# Patient Record
Sex: Male | Born: 1955 | Race: White | Hispanic: No | Marital: Married
Health system: Southern US, Community
[De-identification: ages and names within clinical notes are randomized; demographics above are authoritative.]

---

## 2001-11-09 ENCOUNTER — Encounter: Payer: Self-pay | Admitting: Orthopaedic Surgery

## 2001-11-11 ENCOUNTER — Ambulatory Visit (HOSPITAL_COMMUNITY): Admission: RE | Admit: 2001-11-11 | Discharge: 2001-11-12 | Payer: Self-pay | Admitting: Orthopaedic Surgery

## 2001-11-11 ENCOUNTER — Encounter: Payer: Self-pay | Admitting: Orthopaedic Surgery

## 2001-11-12 ENCOUNTER — Encounter: Payer: Self-pay | Admitting: Orthopaedic Surgery

## 2017-04-01 ENCOUNTER — Other Ambulatory Visit: Payer: Self-pay | Admitting: Orthopaedic Surgery

## 2017-04-01 DIAGNOSIS — M546 Pain in thoracic spine: Secondary | ICD-10-CM

## 2017-04-02 ENCOUNTER — Other Ambulatory Visit: Payer: Self-pay | Admitting: Orthopaedic Surgery

## 2017-04-02 DIAGNOSIS — M546 Pain in thoracic spine: Secondary | ICD-10-CM

## 2017-04-15 ENCOUNTER — Ambulatory Visit
Admission: RE | Admit: 2017-04-15 | Discharge: 2017-04-15 | Disposition: A | Discharge status: Home / Self Care | Payer: Self-pay | Source: Ambulatory Visit | Attending: Orthopaedic Surgery | Admitting: Orthopaedic Surgery

## 2017-04-15 ENCOUNTER — Ambulatory Visit
Admission: RE | Admit: 2017-04-15 | Discharge: 2017-04-15 | Disposition: A | Discharge status: Home / Self Care | Payer: BLUE CROSS/BLUE SHIELD | Source: Ambulatory Visit | Attending: Orthopaedic Surgery | Admitting: Orthopaedic Surgery

## 2017-04-15 ENCOUNTER — Other Ambulatory Visit: Payer: Self-pay | Admitting: Orthopaedic Surgery

## 2017-04-15 ENCOUNTER — Other Ambulatory Visit: Payer: Self-pay

## 2017-04-15 DIAGNOSIS — M545 Low back pain, unspecified: Secondary | ICD-10-CM

## 2017-04-15 DIAGNOSIS — M546 Pain in thoracic spine: Secondary | ICD-10-CM

## 2017-04-15 MED ORDER — IOPAMIDOL (ISOVUE-M 200) INJECTION 41%
15.0000 mL | Freq: Once | INTRAMUSCULAR | Status: AC
  Administered 2017-04-15: 15 mL via INTRATHECAL

## 2017-04-15 MED ORDER — HYDROCODONE-ACETAMINOPHEN 5-325 MG PO TABS
2.0000 | ORAL_TABLET | Freq: Once | ORAL | Status: AC
Start: 2017-04-15 — End: 2017-04-15
  Administered 2017-04-15: 2 via ORAL

## 2017-04-15 MED ORDER — DIAZEPAM 5 MG PO TABS
10.0000 mg | ORAL_TABLET | Freq: Once | ORAL | Status: AC
  Administered 2017-04-15: 10 mg via ORAL

## 2017-04-15 NOTE — Discharge Instructions (Signed)

## 2019-04-11 IMAGING — CR DG MYELOGRAPHY LUMBAR INJ LUMBOSACRAL
12 of 20 series · 12 of 20 positions shown · non-contrast
Comparison: Outside MRI lumbar spine dated September 24, 2016. CT
abdomen pelvis dated October 26, 2014.

CLINICAL DATA: Lumbosacral spondylosis without myelopathy. Low back
pain radiating into the right leg to the calf. Occasional left leg
pain.
TECHNIQUE: Contiguous axial images were obtained through the Lumbar spine after
the intrathecal infusion of contrast. Coronal and sagittal
reconstructions were obtained of the axial image sets.

[w lumbar spine lat]
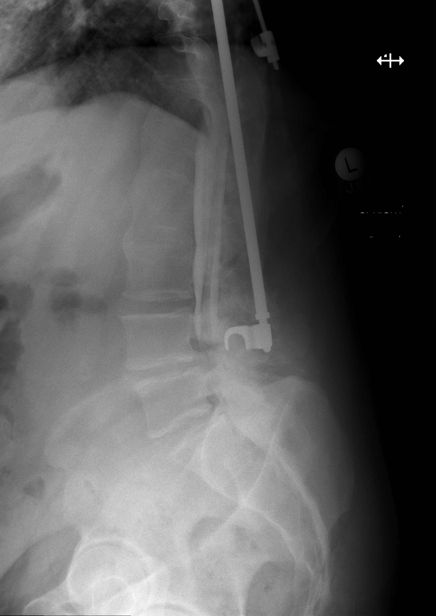

[w lumbar spine flexion]
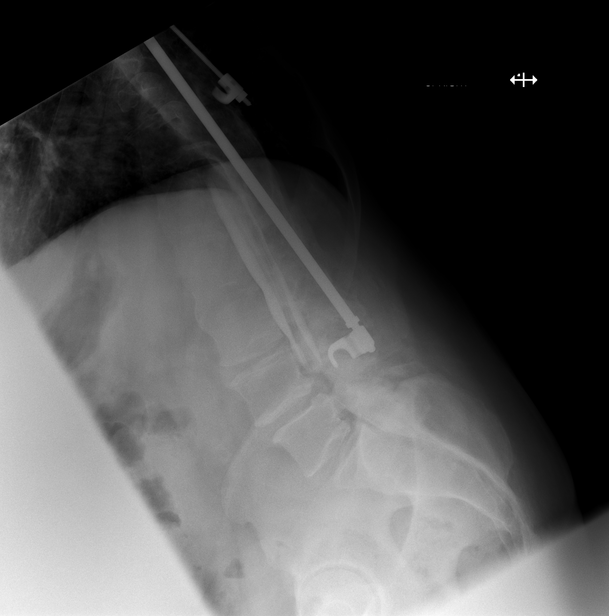

[w lumbar spine extension]
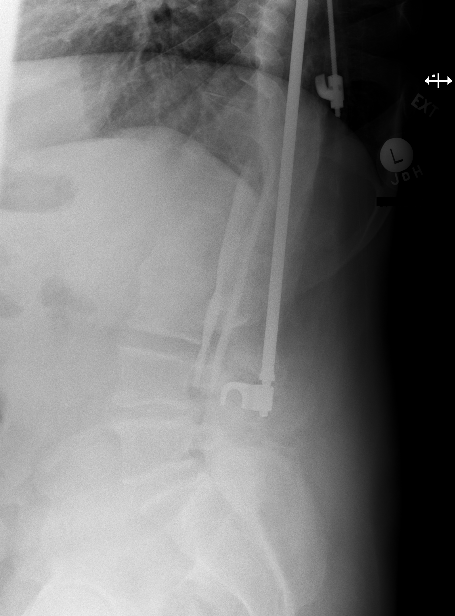

[vasc adipose (1 of 9)]
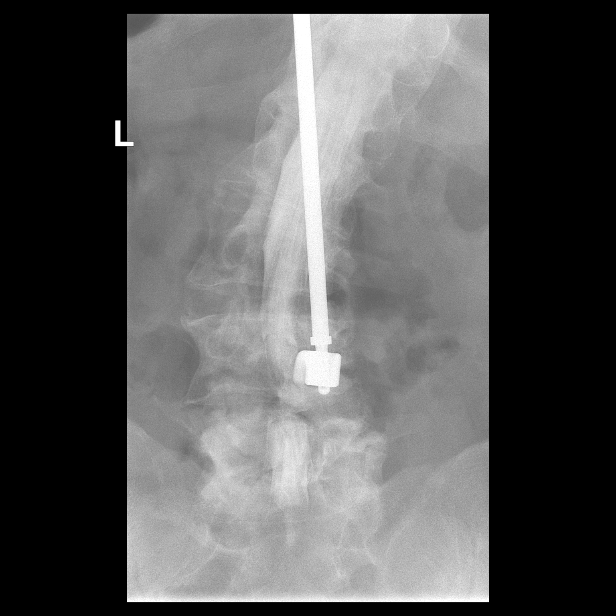

[vasc adipose (2 of 9)]
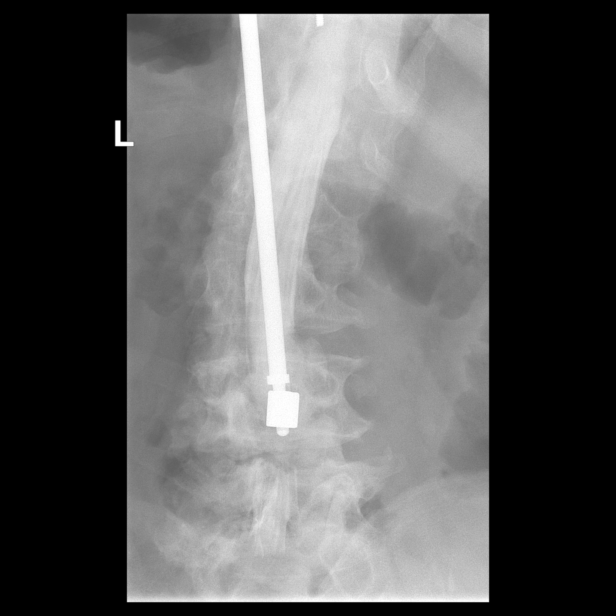

[vasc adipose (3 of 9)]
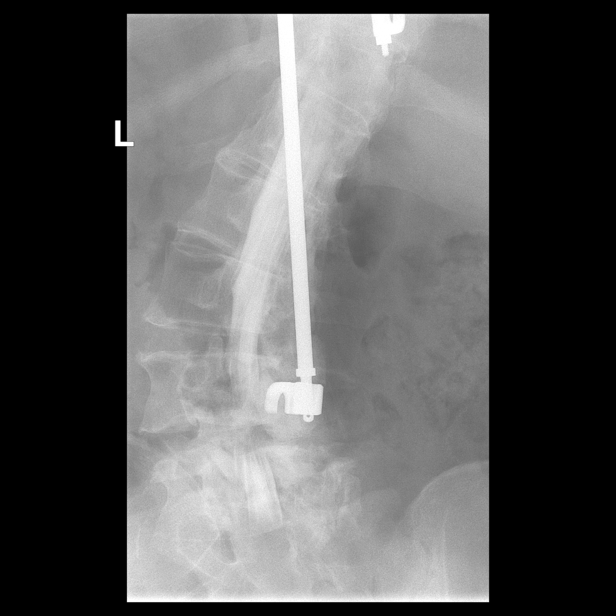

[vasc adipose (4 of 9)]
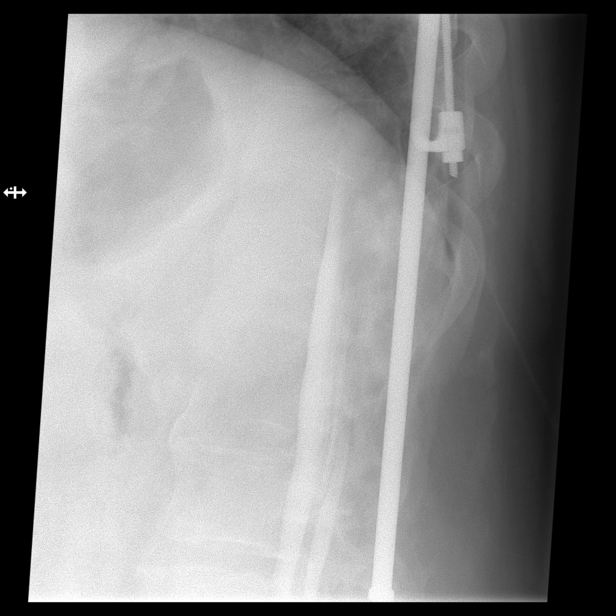

[vasc adipose (5 of 9)]
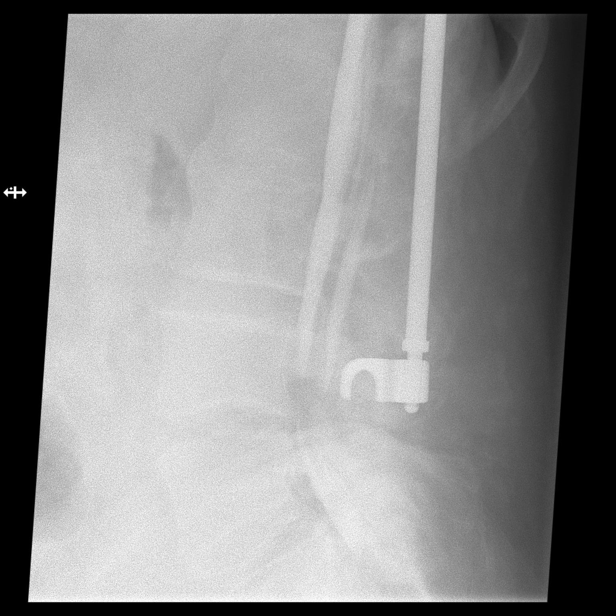

[vasc adipose (6 of 9)]
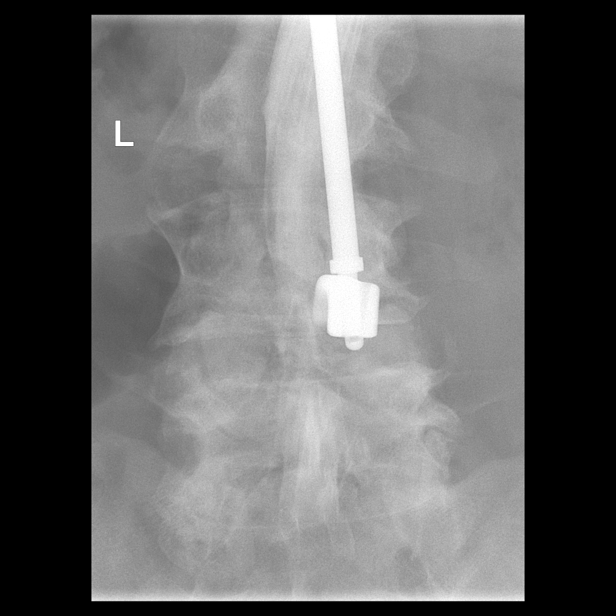

[vasc adipose (7 of 9)]
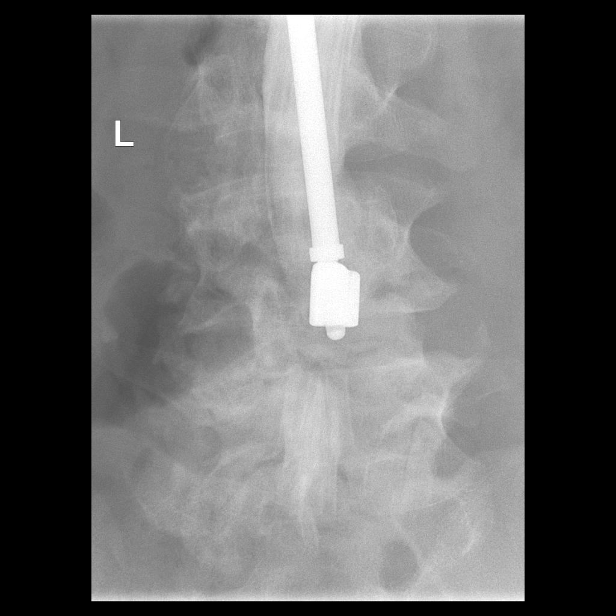

[vasc adipose (8 of 9)]
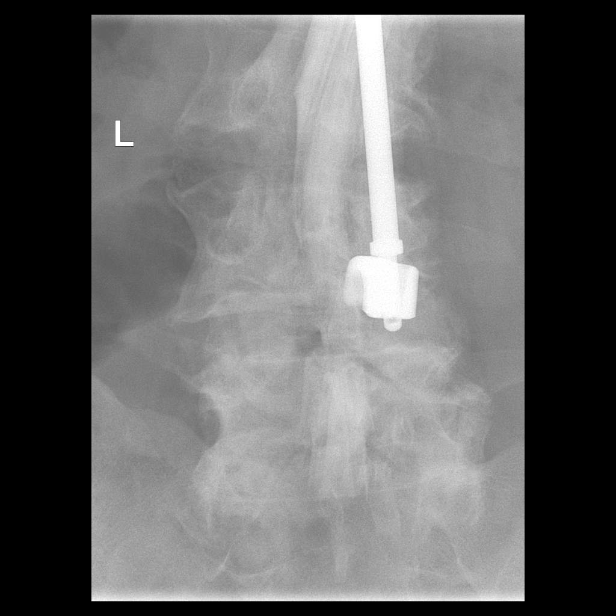

[vasc adipose (9 of 9)]
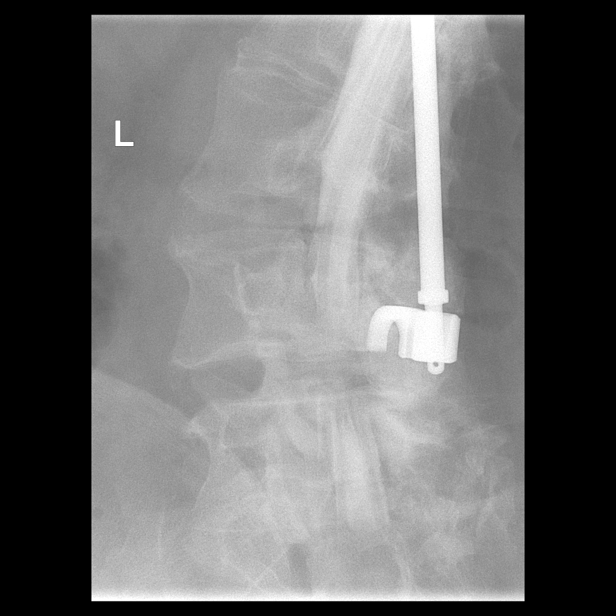

[12 of 20 positions shown; findings below may reference images not displayed]

EXAM:
LUMBAR MYELOGRAM

CT LUMBAR MYELOGRAM

FLUOROSCOPY TIME:  Radiation Exposure Index (as provided by the
fluoroscopic device): 464.3 Gy*m2

Fluoroscopy Time:  26 seconds

Number of Acquired Images:  18

PROCEDURE:
After thorough discussion of risks and benefits of the procedure
including bleeding, infection, injury to nerves, blood vessels,
adjacent structures as well as headache and CSF leak, written and
oral informed consent was obtained. Consent was obtained by Dr.
Carami Zeidan. Time out form was completed.

Patient was positioned prone on the fluoroscopy table. Local
anesthesia was provided with 1% lidocaine without epinephrine after
prepped and draped in the usual sterile fashion. Puncture was
performed at L4-L5 using a 5 inch 22-gauge spinal needle via right
paramedian approach. Using a single pass through the dura, the
needle was placed within the thecal sac, with return of clear CSF.
15 mL of Isovue [id number] was injected into the thecal sac, with normal
opacification of the nerve roots and cauda equina consistent with
free flow within the subarachnoid space.

I personally performed the lumbar puncture and administered the
intrathecal contrast. I also personally supervised acquisition of
the myelogram images.
FINDINGS: LUMBAR MYELOGRAM FINDINGS:

Twelve rib-bearing thoracic vertebra on chest x-ray dated September 04, 2015. Transitional lumbosacral anatomy with four lumbar type
vertebral bodies and sacralization of L5. The last well-formed disc
space is designated L4-L5 for the purposes of this report.
Harrington rod extending to L3. No acute fracture or subluxation.
Vertebral body heights are preserved. Rotatory levoscoliosis of the
lumbar spine, apex at L2-L3. Trace anterolisthesis at L3-L4. 5 mm
anterolisthesis at L4-L5. No dynamic instability. Trace ventral
extradural defect at L2-L3. Mild disc height loss at L3-L4 with
severe central spinal canal stenosis.

CT LUMBAR MYELOGRAM FINDINGS:

Segmentation: Transitional lumbosacral anatomy with 4 lumbar type
vertebral bodies and sacralization of L5. The last well-formed disc
space is designated L4-L5 for the purposes of this report.

Alignment: Rotatory levoscoliosis of the lumbar spine, apex at
L2-L3. Trace anterolisthesis at L3-L4. 5 mm anterolisthesis at
L4-L5.

Vertebrae: There is a single Harrington rod extending to L3. There
is solid osseous fusion of the posterior elements extending from the
thoracic spine to L2. There is no acute fracture. No focal
pathologic process.

Conus medullaris and cauda equina: Conus extends to the L1 level.
There is a small amount of iatrogenic subdural contrast which exerts
mass effect on the thecal sac from T12 to L3. Conus and cauda equina
otherwise appear normal.

Paraspinal and other soft tissues: Mild aortic atherosclerosis.

Disc levels:

T11-T12: Negative.

T12-L1:  Negative.

L1-L2:  Negative.

L2-L3: Left foraminal disc osteophyte complex abuts the exiting left
L2 nerve root. No stenosis.

L3-L4: Circumferential disc bulge with calcified disc material
posteriorly and bilateral facet arthropathy, worse on the right with
ligamentum flavum hypertrophy. Severe central spinal canal stenosis
and severe left and moderate right neuroforaminal stenosis.

L4-L5: Circumferential disc bulge and bilateral facet arthropathy
results in mild bilateral neuroforaminal stenosis. No spinal canal
stenosis.

L5-S1:  Negative.
IMPRESSION: 1. Transitional lumbosacral anatomy with four lumbar type vertebral
bodies and sacralization of L5. Correlation with radiographs is
recommended prior to any operative intervention.
2. Multilevel degenerative changes of the lumbar spine as described
above, worst at L3-L4 where there is severe central spinal canal
stenosis and severe left and moderate right neuroforaminal stenosis.
3. Left foraminal disc osteophyte complex at L2-L3 abuts the exiting
left L3 nerve root.
4. Prior thoracolumbar Harrington rod and bone graft fusion of the
posterior elements, extending to L3. The posterior elements remain
unfused at L2-L3.
5. Rotatory levoscoliosis. Grade 1 anterolisthesis at L4-L5 without
dynamic instability.

## 2022-07-05 ENCOUNTER — Other Ambulatory Visit: Payer: Self-pay

## 2022-07-05 DIAGNOSIS — M4326 Fusion of spine, lumbar region: Secondary | ICD-10-CM
